# Patient Record
Sex: Female | Born: 1975 | Race: Black or African American | Hispanic: No | Marital: Married
Health system: Midwestern US, Community
[De-identification: ages and names within clinical notes are randomized; demographics above are authoritative.]

## PROBLEM LIST (undated history)

## (undated) DIAGNOSIS — M19071 Primary osteoarthritis, right ankle and foot: Secondary | ICD-10-CM

## (undated) DIAGNOSIS — D649 Anemia, unspecified: Secondary | ICD-10-CM

## (undated) DIAGNOSIS — I10 Essential (primary) hypertension: Secondary | ICD-10-CM

## (undated) HISTORY — PX: GASTRIC BYPASS: SHX52

## (undated) HISTORY — PX: OTHER SURGICAL HISTORY: SHX169

---

## 2014-10-01 ENCOUNTER — Emergency Department (HOSPITAL_COMMUNITY)
Admission: EM | Admit: 2014-10-01 | Discharge: 2014-10-01 | Disposition: A | Discharge status: Home / Self Care | Attending: Emergency Medicine | Admitting: Emergency Medicine

## 2014-10-01 ENCOUNTER — Encounter (HOSPITAL_COMMUNITY): Payer: Self-pay | Admitting: Family Medicine

## 2014-10-01 ENCOUNTER — Emergency Department (HOSPITAL_COMMUNITY)

## 2014-10-01 DIAGNOSIS — Z862 Personal history of diseases of the blood and blood-forming organs and certain disorders involving the immune mechanism: Secondary | ICD-10-CM | POA: Diagnosis not present

## 2014-10-01 DIAGNOSIS — R519 Headache, unspecified: Secondary | ICD-10-CM

## 2014-10-01 DIAGNOSIS — R000 Tachycardia, unspecified: Secondary | ICD-10-CM | POA: Diagnosis present

## 2014-10-01 DIAGNOSIS — F419 Anxiety disorder, unspecified: Secondary | ICD-10-CM

## 2014-10-01 DIAGNOSIS — F410 Panic disorder [episodic paroxysmal anxiety] without agoraphobia: Secondary | ICD-10-CM | POA: Diagnosis not present

## 2014-10-01 DIAGNOSIS — M549 Dorsalgia, unspecified: Secondary | ICD-10-CM | POA: Insufficient documentation

## 2014-10-01 DIAGNOSIS — R51 Headache: Secondary | ICD-10-CM | POA: Diagnosis not present

## 2014-10-01 HISTORY — DX: Anemia, unspecified: D64.9

## 2014-10-01 LAB — CBC WITH DIFFERENTIAL/PLATELET
BASOS ABS: 0.1 10*3/uL (ref 0.0–0.1)
Basophils Relative: 1 % (ref 0–1)
EOS ABS: 0.1 10*3/uL (ref 0.0–0.7)
EOS PCT: 2 % (ref 0–5)
HCT: 32.4 % — ABNORMAL LOW (ref 36.0–46.0)
Hemoglobin: 10.4 g/dL — ABNORMAL LOW (ref 12.0–15.0)
LYMPHS PCT: 31 % (ref 12–46)
Lymphs Abs: 1.7 10*3/uL (ref 0.7–4.0)
MCH: 26.4 pg (ref 26.0–34.0)
MCHC: 32.1 g/dL (ref 30.0–36.0)
MCV: 82.2 fL (ref 78.0–100.0)
Monocytes Absolute: 0.5 10*3/uL (ref 0.1–1.0)
Monocytes Relative: 9 % (ref 3–12)
NEUTROS PCT: 57 % (ref 43–77)
Neutro Abs: 3.2 10*3/uL (ref 1.7–7.7)
PLATELETS: 287 10*3/uL (ref 150–400)
RBC: 3.94 MIL/uL (ref 3.87–5.11)
RDW: 17.6 % — AB (ref 11.5–15.5)
WBC: 5.6 10*3/uL (ref 4.0–10.5)

## 2014-10-01 LAB — BASIC METABOLIC PANEL
Anion gap: 13 (ref 5–15)
BUN: 10 mg/dL (ref 6–23)
CALCIUM: 9 mg/dL (ref 8.4–10.5)
CO2: 24 mEq/L (ref 19–32)
Chloride: 95 mEq/L — ABNORMAL LOW (ref 96–112)
Creatinine, Ser: 0.65 mg/dL (ref 0.50–1.10)
GFR calc Af Amer: >90 mL/min (ref >90)
Glucose, Bld: 92 mg/dL (ref 70–99)
POTASSIUM: 3.6 meq/L — AB (ref 3.7–5.3)
Sodium: 132 mEq/L — ABNORMAL LOW (ref 137–147)

## 2014-10-01 LAB — I-STAT TROPONIN, ED: TROPONIN I, POC: 0.01 ng/mL (ref 0.00–0.08)

## 2014-10-01 LAB — D-DIMER, QUANTITATIVE: D-Dimer, Quant: 0.49 ug/mL-FEU — ABNORMAL HIGH (ref 0.00–0.48)

## 2014-10-01 MED ORDER — PROMETHAZINE HCL 25 MG/ML IJ SOLN
25.0000 mg | Freq: Once | INTRAMUSCULAR | Status: AC
  Administered 2014-10-01: 25 mg via INTRAVENOUS
  Filled 2014-10-01: qty 1

## 2014-10-01 MED ORDER — HYDROMORPHONE HCL 1 MG/ML IJ SOLN
0.5000 mg | Freq: Once | INTRAMUSCULAR | Status: AC
  Administered 2014-10-01: 0.5 mg via INTRAVENOUS
  Filled 2014-10-01: qty 1

## 2014-10-01 MED ORDER — HEPARIN SOD (PORK) LOCK FLUSH 100 UNIT/ML IV SOLN
500.0000 [IU] | Freq: Once | INTRAVENOUS | Status: AC
  Administered 2014-10-01: 500 [IU]
  Filled 2014-10-01: qty 5

## 2014-10-01 MED ORDER — OXYCODONE-ACETAMINOPHEN 5-325 MG PO TABS: 2.0000 | ORAL_TABLET | ORAL | Status: AC | PRN

## 2014-10-01 MED ORDER — CYCLOBENZAPRINE HCL 10 MG PO TABS: 10.0000 mg | ORAL_TABLET | Freq: Two times a day (BID) | ORAL | Status: AC | PRN

## 2014-10-01 MED ORDER — SODIUM CHLORIDE 0.9 % IV BOLUS (SEPSIS)
1000.0000 mL | Freq: Once | INTRAVENOUS | Status: AC
  Administered 2014-10-01: 1000 mL via INTRAVENOUS

## 2014-10-01 MED ORDER — PROMETHAZINE HCL 25 MG PO TABS: 25.0000 mg | ORAL_TABLET | Freq: Four times a day (QID) | ORAL | Status: AC | PRN

## 2014-10-01 MED ORDER — HYDROMORPHONE HCL 1 MG/ML IJ SOLN
1.0000 mg | Freq: Once | INTRAMUSCULAR | Status: AC
  Administered 2014-10-01: 1 mg via INTRAVENOUS
  Filled 2014-10-01: qty 1

## 2014-10-01 NOTE — Discharge Instructions (Signed)
Medication for pain and nausea. Increase fluids. Rest in quiet dark room. °

## 2014-10-01 NOTE — ED Notes (Signed)
Per pt sts she is here visiting [place] with her daughter and has been upset all morning. sts upper back pain, feels like her her hear rate and BP are up. sts her daughter is here in the ED as a patient.

## 2014-10-01 NOTE — ED Provider Notes (Signed)
Patient is hemodynamically stable. Decrease headache. No neurological deficits. Blood pressure is improving. Do not think CT head or CT angiography of chest indicated.  discharge medications Percocet and Phenergan 25 mg  Donnetta HutchingBrian Kirin Pastorino, MD 10/01/14 1909

## 2014-10-01 NOTE — ED Provider Notes (Signed)
CSN: [id number]     Arrival date & time 10/01/14  1301 History   First MD Initiated Contact with Patient 10/01/14 1500     Chief Complaint  Patient presents with  . Back Pain  . Tachycardia  . Anxiety      HPI Comments: [name] presents for the evaluation of rapid heart rate and headache. She reports that she came in because her daughter was having seizures and when she was waiting with her daughter she started feeling ill.  She reports headache that is predominantly on the right side as well as right-sided shoulder/back pain that is twinging in nature.she has a history of migraine headaches and prior admission to the hospital for headaches that hadt left-sided paralysis associated with them.  When her symptoms began she felt like her heart rate was high and she felt anxious. She denies history of DVT but does have an indwelling port for her blood transfusions and iron. Her grandmother has a history of DVT. She has a history of high blood pressure, she did take her blood pressure medicine today. Denies fevers, vomiting, hemoptysis, leg edema. She does report easy bruising.  Patient is a 38 y.o. female presenting with back pain and anxiety. The history is provided by the patient.  Back Pain Anxiety    Past Medical History  Diagnosis Date  . Anemia    Past Surgical History  Procedure Laterality Date  . Gastric bypass    . C section     History reviewed. No pertinent family history. History  Substance Use Topics  . Smoking status: Never Smoker   . Smokeless tobacco: Not on file  . Alcohol Use: No   OB History    No data available     Review of Systems  Musculoskeletal: Positive for back pain.  All other systems reviewed and are negative.     Allergies  Compazine; Gabapentin; Nsaids; Reglan; and Zofran  Home Medications   Prior to Admission medications   Not on File   BP 163/109 mmHg  Pulse 110  Temp(Src) 98.3 F (36.8 C) (Oral)  Resp 18  SpO2 98% Physical  Exam  Constitutional: She is oriented to person, place, and time. She appears well-developed and well-nourished.  Mild distress  HENT:  Head: Normocephalic and atraumatic.  Eyes: EOM are normal. Pupils are equal, round, and reactive to light.  Neck: Neck supple.  Mild tenderness over the right scapula and right paraspinous region  Cardiovascular: Normal rate and regular rhythm.   No murmur heard. Pulmonary/Chest: Effort normal and breath sounds normal. No respiratory distress.  Abdominal: Soft. There is no tenderness. There is no rebound and no guarding.  Musculoskeletal: She exhibits no edema or tenderness.  Neurological: She is alert and oriented to person, place, and time.  5 out of 5 strength in all 4 extremities. Sensation to light touch intact in all 4 extremities.  Skin: Skin is warm and dry.  Psychiatric: She has a normal mood and affect. Her behavior is normal.  Nursing note and vitals reviewed.   ED Course  Procedures (including critical care time) Labs Review Labs Reviewed  BASIC METABOLIC PANEL  CBC WITH DIFFERENTIAL  D-DIMER, QUANTITATIVE  I-STAT TROPOININ, ED  POC URINE PREG, ED    Imaging Review No results found.   EKG Interpretation   Date/Time:  Saturday October 01 2014 13:15:03 EST Ventricular Rate:  99 PR Interval:  166 QRS Duration: 82 QT Interval:  368 QTC Calculation: 472 R Axis:  79 Text Interpretation:  Normal sinus rhythm Biatrial enlargement Abnormal  ECG Confirmed by Lincoln Brighamees, Liz [phone]([id number]) on 10/01/2014 3:24:32 PM      MDM   Final diagnoses:  Headache, unspecified headache type    Patient here with headache and neck/back pain following a anxiety attack. Headache is similar to prior migraines feel subarachnoid hemorrhage unlikely. Clinical picture not consistent with dissection or ACS.    Tilden FossaElizabeth Shelina Luo, MD 10/02/14 [phone]

## 2015-07-06 ENCOUNTER — Encounter (HOSPITAL_COMMUNITY): Payer: Self-pay | Admitting: *Deleted

## 2015-07-06 ENCOUNTER — Emergency Department (HOSPITAL_COMMUNITY)
Admission: EM | Admit: 2015-07-06 | Discharge: 2015-07-06 | Disposition: A | Discharge status: Home / Self Care | Attending: Emergency Medicine | Admitting: Emergency Medicine

## 2015-07-06 ENCOUNTER — Emergency Department (HOSPITAL_COMMUNITY)

## 2015-07-06 DIAGNOSIS — M549 Dorsalgia, unspecified: Secondary | ICD-10-CM | POA: Insufficient documentation

## 2015-07-06 DIAGNOSIS — Z862 Personal history of diseases of the blood and blood-forming organs and certain disorders involving the immune mechanism: Secondary | ICD-10-CM | POA: Diagnosis not present

## 2015-07-06 DIAGNOSIS — R112 Nausea with vomiting, unspecified: Secondary | ICD-10-CM | POA: Diagnosis not present

## 2015-07-06 DIAGNOSIS — R1031 Right lower quadrant pain: Secondary | ICD-10-CM | POA: Diagnosis not present

## 2015-07-06 DIAGNOSIS — I10 Essential (primary) hypertension: Secondary | ICD-10-CM | POA: Diagnosis not present

## 2015-07-06 HISTORY — DX: Essential (primary) hypertension: I10

## 2015-07-06 LAB — COMPREHENSIVE METABOLIC PANEL
ALK PHOS: 77 U/L (ref 38–126)
ALT: 36 U/L (ref 14–54)
ANION GAP: 7 (ref 5–15)
AST: 20 U/L (ref 15–41)
Albumin: 3.3 g/dL — ABNORMAL LOW (ref 3.5–5.0)
BUN: 10 mg/dL (ref 6–20)
CALCIUM: 8.7 mg/dL — AB (ref 8.9–10.3)
CO2: 25 mmol/L (ref 22–32)
Chloride: 104 mmol/L (ref 101–111)
Creatinine, Ser: 0.59 mg/dL (ref 0.44–1.00)
Glucose, Bld: 95 mg/dL (ref 65–99)
Potassium: 3.9 mmol/L (ref 3.5–5.1)
Sodium: 136 mmol/L (ref 135–145)
Total Bilirubin: 0.4 mg/dL (ref 0.3–1.2)
Total Protein: 5.9 g/dL — ABNORMAL LOW (ref 6.5–8.1)

## 2015-07-06 LAB — CBC
HCT: 26.6 % — ABNORMAL LOW (ref 36.0–46.0)
HEMOGLOBIN: 8.4 g/dL — AB (ref 12.0–15.0)
MCH: 25.6 pg — AB (ref 26.0–34.0)
MCHC: 31.6 g/dL (ref 30.0–36.0)
MCV: 81.1 fL (ref 78.0–100.0)
Platelets: 322 10*3/uL (ref 150–400)
RBC: 3.28 MIL/uL — AB (ref 3.87–5.11)
RDW: 16.2 % — ABNORMAL HIGH (ref 11.5–15.5)
WBC: 5.5 10*3/uL (ref 4.0–10.5)

## 2015-07-06 LAB — URINALYSIS, ROUTINE W REFLEX MICROSCOPIC
Bilirubin Urine: NEGATIVE
GLUCOSE, UA: NEGATIVE mg/dL
Hgb urine dipstick: NEGATIVE
Ketones, ur: NEGATIVE mg/dL
LEUKOCYTES UA: NEGATIVE
Nitrite: NEGATIVE
PROTEIN: NEGATIVE mg/dL
Specific Gravity, Urine: 1.028 (ref 1.005–1.030)
Urobilinogen, UA: 0.2 mg/dL (ref 0.0–1.0)
pH: 5 (ref 5.0–8.0)

## 2015-07-06 LAB — URINE MICROSCOPIC-ADD ON

## 2015-07-06 LAB — LIPASE, BLOOD: LIPASE: 17 U/L — AB (ref 22–51)

## 2015-07-06 MED ORDER — FENTANYL CITRATE (PF) 100 MCG/2ML IJ SOLN
50.0000 ug | Freq: Once | INTRAMUSCULAR | Status: AC
  Administered 2015-07-06: 50 ug via NASAL

## 2015-07-06 MED ORDER — FENTANYL CITRATE (PF) 100 MCG/2ML IJ SOLN
INTRAMUSCULAR | Status: AC
  Filled 2015-07-06: qty 2

## 2015-07-06 MED ORDER — SODIUM CHLORIDE 0.9 % IV BOLUS (SEPSIS)
1000.0000 mL | Freq: Once | INTRAVENOUS | Status: AC
  Administered 2015-07-06: 1000 mL via INTRAVENOUS

## 2015-07-06 MED ORDER — PROMETHAZINE HCL 25 MG/ML IJ SOLN
12.5000 mg | Freq: Once | INTRAMUSCULAR | Status: AC
  Administered 2015-07-06: 12.5 mg via INTRAVENOUS
  Filled 2015-07-06: qty 1

## 2015-07-06 MED ORDER — IOHEXOL 300 MG/ML  SOLN: 25.0000 mL | Freq: Once | INTRAMUSCULAR | Status: DC | PRN

## 2015-07-06 MED ORDER — SODIUM CHLORIDE 0.9 % IV SOLN
INTRAVENOUS | Status: DC
  Administered 2015-07-06: 20:00:00 via INTRAVENOUS

## 2015-07-06 MED ORDER — HYDROMORPHONE HCL 1 MG/ML IJ SOLN
1.0000 mg | Freq: Once | INTRAMUSCULAR | Status: AC
  Administered 2015-07-06: 1 mg via INTRAVENOUS
  Filled 2015-07-06: qty 1

## 2015-07-06 MED ORDER — IOHEXOL 300 MG/ML  SOLN
80.0000 mL | Freq: Once | INTRAMUSCULAR | Status: AC | PRN
  Administered 2015-07-06: 100 mL via INTRAVENOUS

## 2015-07-06 MED ORDER — HEPARIN SOD (PORK) LOCK FLUSH 100 UNIT/ML IV SOLN
500.0000 [IU] | Freq: Once | INTRAVENOUS | Status: AC
  Administered 2015-07-06: 500 [IU]
  Filled 2015-07-06: qty 5

## 2015-07-06 MED ORDER — DIPHENHYDRAMINE HCL 50 MG/ML IJ SOLN
25.0000 mg | Freq: Once | INTRAMUSCULAR | Status: AC
  Administered 2015-07-06: 25 mg via INTRAVENOUS
  Filled 2015-07-06: qty 1

## 2015-07-06 NOTE — ED Notes (Signed)
Called Pharmacy to get heparin flush.

## 2015-07-06 NOTE — ED Provider Notes (Signed)
CSN: [id number]     Arrival date & time 07/06/15  1731 History   First MD Initiated Contact with Patient 07/06/15 1848     Chief Complaint  Patient presents with  . Abdominal Pain  . Back Pain  . Emesis     (Consider location/radiation/quality/duration/timing/severity/associated sxs/prior Treatment) Patient is a 39 y.o. female presenting with abdominal pain, back pain, and vomiting. The history is provided by the patient.  Abdominal Pain Associated symptoms: nausea   Associated symptoms: no chest pain, no diarrhea, no dysuria, no fever, no shortness of breath and no vomiting   Back Pain Associated symptoms: abdominal pain   Associated symptoms: no chest pain, no dysuria, no fever and no headaches   Emesis Associated symptoms: abdominal pain   Associated symptoms: no diarrhea and no headaches    patient lives in the [place] area. Was traveling through. Onset of right lower quadrant abdominal pain at about 6 this morning. Pain is 10 out of 10 pain radiates to the back. Associated with nausea no vomiting no diarrhea. Patient to gastric bypass surgery in 2008 at [facility]. Patient also had a hysterectomy in the spring for dysfunctional uterine bleeding and anemia problems. Patient still has some baseline anemia. Patient denies fever. Pain is very sharp in nature.  Past Medical History  Diagnosis Date  . Anemia   . Hypertension    Past Surgical History  Procedure Laterality Date  . Gastric bypass    . C section     No family history on file. Social History  Substance Use Topics  . Smoking status: Never Smoker   . Smokeless tobacco: None  . Alcohol Use: No   OB History    No data available     Review of Systems  Constitutional: Negative for fever.  HENT: Negative for congestion.   Eyes: Negative for visual disturbance.  Respiratory: Negative for shortness of breath.   Cardiovascular: Negative for chest pain.  Gastrointestinal: Positive for nausea and  abdominal pain. Negative for vomiting and diarrhea.  Genitourinary: Negative for dysuria.  Musculoskeletal: Positive for back pain.  Skin: Negative for rash.  Neurological: Negative for headaches.  Hematological: Does not bruise/bleed easily.  Psychiatric/Behavioral: Negative for confusion.      Allergies  Compazine; Gabapentin; Nsaids; Reglan; Tylenol; Zanaflex; and Zofran  Home Medications   Prior to Admission medications   Medication Sig Start Date End Date Taking? Authorizing Provider  cyclobenzaprine (FLEXERIL) 10 MG tablet Take 1 tablet (10 mg total) by mouth 2 (two) times daily as needed for muscle spasms. 10/01/14   Donnetta Hutching, MD  oxyCODONE-acetaminophen (PERCOCET) 5-325 MG per tablet Take 2 tablets by mouth every 4 (four) hours as needed. 10/01/14   Donnetta Hutching, MD  promethazine (PHENERGAN) 25 MG tablet Take 1 tablet (25 mg total) by mouth every 6 (six) hours as needed for nausea. 10/01/14   Donnetta Hutching, MD   BP 134/62 mmHg  Pulse 77  Temp(Src) 98.4 F (36.9 C) (Oral)  Resp 20  SpO2 97% Physical Exam  Constitutional: She is oriented to person, place, and time. She appears well-developed and well-nourished. She appears distressed.  HENT:  Head: Normocephalic and atraumatic.  Mouth/Throat: Oropharynx is clear and moist.  Eyes: Conjunctivae and EOM are normal. Pupils are equal, round, and reactive to light.  Neck: Normal range of motion.  Cardiovascular: Normal rate, regular rhythm and normal heart sounds.   No murmur heard. Pulmonary/Chest: Effort normal and breath sounds normal. No respiratory distress.  Abdominal: Soft. Bowel sounds are normal. There is no tenderness.  Musculoskeletal: Normal range of motion. She exhibits no edema.  Neurological: She is alert and oriented to person, place, and time. No cranial nerve deficit. She exhibits normal muscle tone. Coordination normal.  Skin: Skin is warm. No erythema.  Nursing note and vitals reviewed.   ED Course   Procedures (including critical care time) Labs Review Labs Reviewed  LIPASE, BLOOD - Abnormal; Notable for the following:    Lipase 17 (*)    All other components within normal limits  COMPREHENSIVE METABOLIC PANEL - Abnormal; Notable for the following:    Calcium 8.7 (*)    Total Protein 5.9 (*)    Albumin 3.3 (*)    All other components within normal limits  CBC - Abnormal; Notable for the following:    RBC 3.28 (*)    Hemoglobin 8.4 (*)    HCT 26.6 (*)    MCH 25.6 (*)    RDW 16.2 (*)    All other components within normal limits  URINALYSIS, ROUTINE W REFLEX MICROSCOPIC (NOT AT [facility]) - Abnormal; Notable for the following:    APPearance TURBID (*)    All other components within normal limits  URINE MICROSCOPIC-ADD ON - Abnormal; Notable for the following:    Crystals CA OXALATE CRYSTALS (*)    All other components within normal limits    Imaging Review Ct Abdomen Pelvis W Contrast  07/06/2015   CLINICAL DATA:  Abdominal pain  EXAM: CT ABDOMEN AND PELVIS WITH CONTRAST  TECHNIQUE: Multidetector CT imaging of the abdomen and pelvis was performed using the standard protocol following bolus administration of intravenous contrast.  CONTRAST:  OMNIPAQUE IOHEXOL 300 MG/ML  SOLN  COMPARISON:  None.  FINDINGS: BODY WALL: Subcutaneous reticulation over the low pelvis is likely scarring neighboring remote incision. Vulva piercing. Small fatty right inguinal hernia.  LOWER CHEST: Small sliding hiatal hernia with gastroesophageal reflux or poor clearance from the esophagus.  ABDOMEN/PELVIS:  Liver: No focal abnormality.  Biliary: Cholecystectomy.  Pancreas: Unremarkable.  Spleen: Unremarkable.  Adrenals: Unremarkable.  Kidneys and ureters: No hydronephrosis or stone. 1 cm probable cyst in the upper right kidney.  Bladder: Unremarkable.  Reproductive: Hysterectomy. The right ovary at least likely remains, and is contiguous with small volume simple density fluid in the pelvis.  Bowel: Gastric  bypass without obstruction. No inflammatory bowel wall thickening. Prominent volume of stool and gas throughout the colon. No appendicitis.  Retroperitoneum: No mass or adenopathy.  Peritoneum: Small free pelvic fluid which could be physiologic given the right ovary likely remains.  Vascular: No acute abnormality.  OSSEOUS: Dorsal column stimulator. Relatively mild L5-S1 disc degeneration.  IMPRESSION: 1. Prominent colonic gas and stool. 2. Small pelvic fluid which can be physiologic. 3. Gastric bypass, cholecystectomy, and hysterectomy.   Electronically Signed   By: Marnee Spring M.D.   On: 07/06/2015 21:44   Dg Chest Port 1 View  07/06/2015   CLINICAL DATA:  Chest pain for 1 day.  EXAM: PORTABLE CHEST - 1 VIEW  COMPARISON:  10/01/2014.  FINDINGS: Normal sized heart. Interval minimal linear density at the left lung base. Otherwise, clear lungs. Right PICC tip in the superior vena cava. Neural stimulator leads overlying the lower thoracic spine.  IMPRESSION: Interval minimal left basilar linear atelectasis or scarring.   Electronically Signed   By: Beckie Salts M.D.   On: 07/06/2015 20:22   I have personally reviewed and evaluated these images and lab results  as part of my medical decision-making.   EKG Interpretation None      MDM   Final diagnoses:  Right lower quadrant abdominal pain    Patient with extensive workup for fairly severe right lower quadrant abdominal pain patient also has gastric bypass surgery in the past however CT shows that the the bypasses all patent no significant findings. No significant lab findings. No leukocytosis patient does have some mild anemia but transfusion not required. Urinalysis has some calcium oxalate crystals but no evidence of any ureteral stones. Patient improved here with fluids pain medicine and antinausea medicine. Patient does have a history of IBS perhaps that was the cause of the pain. Patient is from the [facility]  area.    Vanetta Mulders, MD 07/06/15 [phone]

## 2015-07-06 NOTE — Discharge Instructions (Signed)
Continue your current medications. Follow-up with your doctors upon return to [place]. Today's workup without any significant findings.

## 2015-07-06 NOTE — ED Notes (Signed)
Patient returned from CT

## 2015-07-06 NOTE — ED Notes (Signed)
Pt is here with pain to right lower back and right lower abdomen.  Reports nausea and vomiting.  No urinary symptoms. 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 LMP: Hysterectomy

## 2015-07-06 NOTE — ED Notes (Signed)
Patient is itching after medication given. MD made aware of patient itching and patient's complaint of nausea.

## 2015-07-06 NOTE — ED Notes (Signed)
Selena Batten, RN at the bedside attempting access.

## 2015-07-06 NOTE — ED Notes (Signed)
MD made aware that patient's port was accessed and he could come see patient. MD at the bedside.

## 2015-07-06 NOTE — ED Notes (Signed)
Patient transported to CT with transporter 

## 2015-07-06 NOTE — ED Notes (Signed)
Attempted Port Access Twice. Unable to access.

## 2016-10-08 ENCOUNTER — Ambulatory Visit: Admit: 2016-10-08 | Discharge: 2016-10-08 | Payer: TRICARE (CHAMPUS) | Attending: Family | Primary: Family

## 2016-10-08 DIAGNOSIS — I10 Essential (primary) hypertension: Secondary | ICD-10-CM

## 2016-10-08 MED ORDER — ACETAMINOPHEN-CODEINE 300 MG-60 MG TAB
300-60 mg | ORAL_TABLET | Freq: Three times a day (TID) | ORAL | 0 refills | Status: DC | PRN
Start: 2016-10-08 — End: 2016-10-17

## 2016-10-08 MED ORDER — TRAMADOL 50 MG TAB
50 mg | ORAL_TABLET | Freq: Three times a day (TID) | ORAL | 0 refills | Status: DC | PRN
Start: 2016-10-08 — End: 2016-10-08

## 2016-10-08 MED ORDER — RIFAXIMIN 550 MG TAB
550 mg | ORAL_TABLET | Freq: Three times a day (TID) | ORAL | 0 refills | Status: DC
Start: 2016-10-08 — End: 2016-10-17

## 2016-10-08 NOTE — Progress Notes (Signed)
Chief Complaint   Patient presents with   ??? Establish Care   ??? Other     IBS-D       Sara Elliott is a 40 y.o. female        HPI  Patient coming in today to establish care.    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  States that she takes bentyl 10mg  qid as needed for pain, and diarrhea.  States that taking phenegran suppositories, and anti-diarrhea medications.  States that she takes the cholestyramine 4 gram tid with meals.  States that still having frequent movements when on this regiment, but was somewhat controlled.  States that was having 12 BMs a day, and hemorroids.  States that was seeing a pain management doctor and given belbuqa, states that when she started it, was having severe diarrhea, and IBS got much worse.   Has had gall bladder removed.   Denies ever using viberzi or xifaxin.    She had numerous scopes, and was told to see a gastro provider.     States that was having SVT runs, and was put on [place] by primary doctor.   States that in past 4 weeks, has had 3 episodes of svt, but calmed down.    States that had on some blood  Pressure.  States that her old primary care doctor took her off it.   States that potassium iron, and calcium has been off.   Has ankle fracture, and unable to remove hardware due to arthritis.  States that this is what was seeing pain management for.      Past Medical History:   Diagnosis Date   ??? Anemia    ??? Anxiety    ??? Arthritis    ??? Depression    ??? Hypertension    ??? Irritable bowel syndrome (IBS)     with diarrhea   ??? Migraine    ??? Paralysis (HCC)    ??? PTSD (post-traumatic stress disorder)    ??? SVT (supraventricular tachycardia) (HCC)        Social History     Social History   ??? Marital status: MARRIED     Spouse name: N/A   ??? Number of children: N/A   ??? Years of education: N/A     Occupational History   ??? Not on file.     Social History Main Topics   ??? Smoking status: Current Some Day Smoker     Years: 22.00   ??? Smokeless tobacco: Never Used   ??? Alcohol use Yes       Comment: occ   ??? Drug use: No   ??? Sexual activity: Yes     Partners: Male     Other Topics Concern   ??? Not on file     Social History Narrative   ??? No narrative on file       Family History   Problem Relation Age of Onset   ??? Hypertension Mother    ??? Hypertension Father    ??? Osteoporosis Maternal Grandmother    ??? Hypertension Maternal Grandmother    ??? Heart Disease Maternal Grandmother    ??? Alcohol abuse Maternal Grandfather    ??? Hypertension Paternal Grandmother    ??? Breast Cancer Paternal Grandmother    ??? No Known Problems Paternal Grandfather        Current Outpatient Prescriptions   Medication Sig Dispense Refill   ??? loperamide (IMODIUM) 2 mg capsule TK 1 C PO QID PRN  4   ??? hydrOXYzine HCl (ATARAX) 50 mg tablet TK 1 T PO HS  2   ??? busPIRone (BUSPAR) 5 mg tablet TK 1 T PO BID  2   ??? escitalopram oxalate (LEXAPRO) 20 mg tablet TK 1 T PO  QAM  1   ??? CARTIA XT 240 mg ER capsule   2   ??? dicyclomine (BENTYL) 10 mg capsule TK ONE C PO  QID PRN P  0   ??? PROMETHEGAN 25 mg suppository INSERT 1 SUPPOSITORY RECTALLY Q 6 H PRN  0   ??? QUEtiapine (SEROQUEL) 100 mg tablet TK 1 T PO  QHS  1   ??? CHOLESTYRAMINE LIGHT 4 gram packet   3   ??? eszopiclone (LUNESTA) 3 mg tablet Take  by mouth nightly.     ??? prazosin (MINIPRESS) 1 mg capsule Take  by mouth nightly.     ??? lidocaine (LIDODERM) 5 %   9         Ros- see hpi      Visit Vitals   ??? BP (!) 168/110   ??? Pulse 68   ??? Temp 97.7 ??F (36.5 ??C) (Tympanic)   ??? Ht 5\' 6"  (1.676 m)   ??? Wt 169 lb (76.7 kg)   ??? SpO2 98%   ??? BMI 27.28 kg/m2           Physical Examination: General appearance - alert, well appearing, and in no distress and oriented to person, place, and time  Mental status - alert, oriented to person, place, and time, normal mood, behavior, speech, dress, motor activity, and thought processes  Eyes - pupils equal and reactive, extraocular eye movements intact  Ears - bilateral TM's and external ear canals normal  Nose - normal and patent, no erythema, discharge or polyps   Mouth - mucous membranes moist, pharynx normal without lesions  Neck - supple, no significant adenopathy  Lymphatics - no palpable lymphadenopathy, no hepatosplenomegaly  Chest - clear to auscultation, no wheezes, rales or rhonchi, symmetric air entry  Heart - normal rate, regular rhythm, normal S1, S2, no murmurs, rubs, clicks or gallops  Abdomen - soft, nontender, nondistended, no masses or organomegaly  Neurological - alert, oriented, normal speech, no focal findings or movement disorder noted  Musculoskeletal - no joint tenderness, deformity or swelling  Skin - right foot, on the ball of the right foot, is an area, that appears to be a callous.          .  Assessment and Plan:        1. Essential hypertension  Will continue cartia.  Will check labs , needs to probably get back on benazapril, but want to see what potassium is.   - Lipid Panel ([id number])  - TSH (82956([id number])  - CBC With Differential/Platelet (21308(85025)  - Comp. Metabolic Panel (14) (80053)    2. Irritable bowel syndrome with diarrhea  Will try xifaxin, pt. Instructed on use.  Will stop the immodium for now.  Will still use the bentyl.      3. Iron deficiency anemia, unspecified iron deficiency anemia type  Will check levels today. Has had iron infusions in past.  States that hasn'tt had one this year, but is having pica..   - Iron and TIBC (83540/ 83550)  - Ferritin, Serum (82728)    4. DDD (degenerative disc disease), cervical      5. DDD (degenerative disc disease), lumbar      7. Arthritis of right ankle  Will refer to pain management for evaluation.  Looks like tried Hydrographic surveyorbelbuca and didn't work.  I"m going to give her  30 day script of tramadol to try to help with pain while awaiting referral.

## 2016-10-09 LAB — METABOLIC PANEL, COMPREHENSIVE
A-G Ratio: 1.5 (ref 1.2–2.2)
ALT (SGPT): 16 IU/L (ref 0–32)
AST (SGOT): 22 IU/L (ref 0–40)
Albumin: 4.1 g/dL (ref 3.5–5.5)
Alk. phosphatase: 60 IU/L (ref 39–117)
BUN/Creatinine ratio: 21 (ref 9–23)
BUN: 14 mg/dL (ref 6–20)
Bilirubin, total: 0.3 mg/dL (ref 0.0–1.2)
CO2: 22 mmol/L (ref 18–29)
Calcium: 8.8 mg/dL (ref 8.7–10.2)
Chloride: 101 mmol/L (ref 96–106)
Creatinine: 0.67 mg/dL (ref 0.57–1.00)
GFR est AA: 129 mL/min/{1.73_m2} (ref >59)
GFR est non-AA: 112 mL/min/{1.73_m2} (ref >59)
GLOBULIN, TOTAL: 2.8 g/dL (ref 1.5–4.5)
Glucose: 91 mg/dL (ref 65–99)
Potassium: 4.3 mmol/L (ref 3.5–5.2)
Protein, total: 6.9 g/dL (ref 6.0–8.5)
Sodium: 138 mmol/L (ref 134–144)

## 2016-10-09 LAB — LIPID PANEL
Cholesterol, total: 134 mg/dL (ref 100–199)
HDL Cholesterol: 86 mg/dL (ref >39)
LDL, calculated: 34 mg/dL (ref 0–99)
Triglyceride: 70 mg/dL (ref 0–149)
VLDL, calculated: 14 mg/dL (ref 5–40)

## 2016-10-09 LAB — IRON PROFILE
Iron % saturation: 28 % (ref 15–55)
Iron: 117 ug/dL (ref 27–159)
TIBC: 412 ug/dL (ref 250–450)
UIBC: 295 ug/dL (ref 131–425)

## 2016-10-09 LAB — FERRITIN: Ferritin: 13 ng/mL — ABNORMAL LOW (ref 15–150)

## 2016-10-09 LAB — CBC WITH AUTOMATED DIFF
ABS. BASOPHILS: 0 10*3/uL (ref 0.0–0.2)
ABS. EOSINOPHILS: 0.3 10*3/uL (ref 0.0–0.4)
ABS. IMM. GRANS.: 0 10*3/uL (ref 0.0–0.1)
ABS. MONOCYTES: 0.4 10*3/uL (ref 0.1–0.9)
ABS. NEUTROPHILS: 3.1 10*3/uL (ref 1.4–7.0)
Abs Lymphocytes: 2.4 10*3/uL (ref 0.7–3.1)
BASOPHILS: 1 %
EOSINOPHILS: 4 %
HCT: 37 % (ref 34.0–46.6)
HGB: 11.9 g/dL (ref 11.1–15.9)
IMMATURE GRANULOCYTES: 0 %
Lymphocytes: 39 %
MCH: 30 pg (ref 26.6–33.0)
MCHC: 32.2 g/dL (ref 31.5–35.7)
MCV: 93 fL (ref 79–97)
MONOCYTES: 7 %
NEUTROPHILS: 49 %
PLATELET: 240 10*3/uL (ref 150–379)
RBC: 3.97 x10E6/uL (ref 3.77–5.28)
RDW: 15.3 % (ref 12.3–15.4)
WBC: 6.3 10*3/uL (ref 3.4–10.8)

## 2016-10-09 LAB — TSH 3RD GENERATION: TSH: 0.515 u[IU]/mL (ref 0.450–4.500)

## 2016-10-09 MED ORDER — BENAZEPRIL 10 MG TAB
10 mg | ORAL_TABLET | Freq: Every day | ORAL | 3 refills | Status: AC
Start: 2016-10-09 — End: ?

## 2016-10-09 NOTE — Telephone Encounter (Signed)
Sorry to hear that, then don't take the codeine anymore  There isn't another option for ibs, but we can try to do a PA for the xifaxin if available.

## 2016-10-09 NOTE — Progress Notes (Signed)
Cholesterol is fantastic.   Thyroid is really good.   Glucose, kidney, liver, potassium, and other electrolyes are good.   Iron levels are actually ok, right now, ferritin a little low, but hgb staying at an ok level of 11.9.    So I am going to send her in a script for benazepril to help with her b/p . Take medication daily  With her cartia.

## 2016-10-09 NOTE — Progress Notes (Signed)
lvm for a call back

## 2016-10-09 NOTE — Telephone Encounter (Signed)
Patient called stating that insurance will not cover the medication you prescribed for her IBS. Pt is requesting an alternative or to speak with the pharmacist. Pt also called to inform us that the tylenol 4 you prescribed her with codeine caused her to go into anaphylactic shock and EMS was called to get her from work. Pt was transported to [facility] and she was told she is allergic to codeine. Please advise

## 2016-10-09 NOTE — Addendum Note (Signed)
Addended by: Sophronia SimasKEATON, Ermel Verne T on: 10/09/2016 09:00 AM      Modules accepted: Orders

## 2016-10-09 NOTE — Telephone Encounter (Signed)
lvm w/ below info.  I called [phone] for the pa form for rifaximin 550mg .  Her ID is [id number]. They are faxing the form over now

## 2016-10-09 NOTE — Progress Notes (Signed)
Pt stopped into the office today so I took her back to a room to take her vitals and give her her lab results.Her vitals in the room today were 139-89 BP, oxygen 96% and pulse 106.  She got my message that matt NP couldn't give her any pain medication

## 2016-10-14 NOTE — Telephone Encounter (Signed)
lvm for pt to call back and discuss labs

## 2016-10-14 NOTE — Telephone Encounter (Signed)
Pa form on matthews NP desk

## 2016-10-14 NOTE — Telephone Encounter (Signed)
Patient called for her lab results. Please advise.

## 2016-10-16 ENCOUNTER — Encounter: Attending: Internal Medicine | Primary: Family

## 2016-10-17 ENCOUNTER — Encounter: Attending: Cardiovascular Disease | Primary: Family

## 2016-10-17 ENCOUNTER — Ambulatory Visit: Admit: 2016-10-17 | Discharge: 2016-10-17 | Payer: TRICARE (CHAMPUS) | Attending: Internal Medicine | Primary: Family

## 2016-10-17 ENCOUNTER — Ambulatory Visit: Attending: Internal Medicine | Primary: Family

## 2016-10-17 DIAGNOSIS — I471 Supraventricular tachycardia: Secondary | ICD-10-CM

## 2016-10-17 NOTE — Progress Notes (Signed)
[address], [address]  [place], [place] [address]  PHONE: [phone]      Sara Elliott  07-Jul-1976      SUBJECTIVE:   Sara Elliott is a 40 y.o. female seen for a consultation visit regarding the following:     Chief Complaint   Patient presents with   ??? Referral / Consult     SVT per Dr. Hale Bogus          HPI:  Consultation is requested by Earney Hamburg, NP for evaluation of Referral / Consult (SVT per Dr. Hale Bogus)  She has h/o SVT, dx in [place] last year.  Been on cartia.  Been seen in [place] ERs, given adenosine and others before as she tells me today.  Also seen in [facility] ER for SVT.  Having more tachy-palpitations still now.   More episodes now.  Has not worn monitor since last yr in [place].   Having 4-5 episodes per week, assoc fatigue and weakness as well.    No edema.  Patient denies recent history of orthopnea, PND, excessive dizziness and/or syncope.  I have reviewed the prior cardiac testing and results with the patient today.     She does struggle with PTSD, anxiety and depression by her admission today.          Past Medical History, Past Surgical History, Family history, Social History, and Medications were all reviewed with the patient today and updated as necessary.     Outpatient Prescriptions Marked as Taking for the 10/17/16 encounter (Office Visit) with Guss Bunde, DO   Medication Sig Dispense Refill   ??? spironolactone (ALDACTONE) 50 mg tablet Take  by mouth daily.     ??? benazepril (LOTENSIN) 10 mg tablet Take 1 Tab by mouth daily. 30 Tab 3   ??? loperamide (IMODIUM) 2 mg capsule TK 1 C PO QID PRN  4   ??? hydrOXYzine HCl (ATARAX) 50 mg tablet TK 1 T PO HS  2   ??? busPIRone (BUSPAR) 5 mg tablet TK 1 T PO BID  2   ??? escitalopram oxalate (LEXAPRO) 20 mg tablet TK 1 T PO  QAM  1   ??? CARTIA XT 240 mg ER capsule   2   ??? dicyclomine (BENTYL) 10 mg capsule TK ONE C PO  QID PRN P  0   ??? PROMETHEGAN 25 mg suppository INSERT 1 SUPPOSITORY RECTALLY Q 6 H PRN  0    ??? QUEtiapine (SEROQUEL) 100 mg tablet TK 1 T PO  QHS  1   ??? CHOLESTYRAMINE LIGHT 4 gram packet   3   ??? eszopiclone (LUNESTA) 3 mg tablet Take  by mouth nightly.     ??? prazosin (MINIPRESS) 1 mg capsule Take  by mouth nightly.     ??? lidocaine (LIDODERM) 5 %   9     Allergies   Allergen Reactions   ??? Codeine Anaphylaxis   ??? Compazine [Prochlorperazine] Other (comments)     irritable   ??? Gabapentin Hives     Swollen throat   ??? 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 Reglan [Metoclopramide Hcl] Anxiety     irritable   ??? Zanaflex [Tizanidine] Rash     itchy   ??? Zofran (As Hydrochloride) [Ondansetron Hcl] Other (comments)     headaches     Patient Active Problem List    Diagnosis   ??? Paroxysmal SVT (supraventricular tachycardia) (HCC)   ???  Tobacco abuse   ??? DDD (degenerative disc disease), lumbar   ??? DDD (degenerative disc disease), cervical   ??? Essential hypertension   ??? Irritable bowel syndrome with diarrhea   ??? Iron deficiency anemia     Past Surgical History:   Procedure Laterality Date   ??? HX CESAREAN SECTION      5   ??? HX CHOLECYSTECTOMY  2015   ??? HX GASTRIC BYPASS  2008   ??? HX GYN  2014    partial HYSTER   ??? HX ORTHOPAEDIC      9 screws in RT ankle and 2 plates in RT ankle   ??? HX OTHER SURGICAL  2014    neurostimulator implant   ??? HX OTHER SURGICAL  2012    power port RT arm    ??? HX OTHER SURGICAL      tummy tuck 2     Family History   Problem Relation Age of Onset   ??? Hypertension Mother    ??? Hypertension Father    ??? Osteoporosis Maternal Grandmother    ??? Hypertension Maternal Grandmother    ??? Heart Disease Maternal Grandmother    ??? Alcohol abuse Maternal Grandfather    ??? Hypertension Paternal Grandmother    ??? Breast Cancer Paternal Grandmother    ??? No Known Problems Paternal Grandfather      Social History   Substance Use Topics   ??? Smoking status: Current Some Day Smoker     Years: 22.00   ??? Smokeless tobacco: Never Used   ??? Alcohol use Yes       Comment: occ       ROS:    Constitutional:   Negative for fevers and unexplained weight loss.  Eyes:   Negative for visual disturbance.  ENT:   Negative for significant hearing loss and tinnitus.  Respiratory:   Negative for hemoptysis.  Cardiovascular:   Negative except as noted in HPI.  Gastrointestinal:   Negative for melena and abdominal pain.  Genitourinary:   Negative for hematuria, renal stones.  Integumentary:   Negative for rash or non-healing wounds  Hematologic/Lymphatic:   Negative for excessive bleeding hx or clotting disorder.  Musculoskeletal:  Negative for active, unexplained/severe joint pain.  Neurological:   Negative for stroke.   Behavioral/Psych:   Negative for suicidal ideations.   Endocrine:   Negative for uncontrolled diabetic symptoms including polyuria, polydipsia and poor wound healing.     PHYSICAL EXAM:       Visit Vitals   ??? BP 146/88   ??? Pulse 78   ??? Ht 5\' 6"  (1.676 m)   ??? Wt 164 lb (74.4 kg)   ??? BMI 26.47 kg/m2      General/Constitutional:   Alert and oriented x 3, no acute distress  HEENT:   normocephalic, atraumatic, moist mucous membranes  Neck:   No JVD or carotid bruits bilaterally  Cardiovascular:   regular rate and rhythm, no murmur/rub/gallop appreciated  Pulmonary:   clear to auscultation bilaterally, no respiratory distress  Abdomen:   soft, non-tender, non-distended  Ext:   No sig LE edema bilaterally  Skin:  warm and dry, no obvious rashes seen  Neuro:   no obvious sensory or motor deficits  Psychiatric:   normal mood and affect    EKG Today: sinus rhythm, normal intervals and non-specific ST/T wave changes.    Medical problems, medical history and test results were reviewed with the patient today.  No results found for this or any previous visit (from the past 168 hour(s)).  Lab Results   Component Value Date/Time    Cholesterol, total 134 10/08/2016 04:25 PM    HDL Cholesterol 86 10/08/2016 04:25 PM    LDL, calculated 34 10/08/2016 04:25 PM     VLDL, calculated 14 10/08/2016 04:25 PM    Triglyceride 70 10/08/2016 04:25 PM         ASSESSMENT:    Diagnoses and all orders for this visit:    1. Supraventricular tachycardia (HCC)  -     AMB POC EKG ROUTINE W/ 12 LEADS, INTER & REP  -     AMB POC EXTERNAL MOBILE CV TELEMETRY W/I&REPORT UP TO 30 DAYS  -     AMB POC ECG EVENT MONITOR, UP TO 30 DAYS  -     ECHO COMPLETE STUDY; Future    2. Essential hypertension    3. Paroxysmal SVT (supraventricular tachycardia) (HCC)    4. Tobacco abuse          IMPRESSION:     1. PSVT:  H/o such in [place].  More palp now.  Sx getting worse.  Plan on tele monitor and echo.  Then refer to EP for eval and mgmt.  Labs reviewed.   No NST or LHC needed, no angina.   Ablation reviewed today as potential treatment option, she is very interested.   Remain on the CCB for now.     2. HTN:  Follow, has been well controlled.  Check echo.      3. Tobacco Use:  Cessation stressed.     The patient has been instructed to call with any angina or equivalent as reviewed today. All questions were answered with the patient voicing complete understanding.      Patient has been instructed and agrees to call our office with any issues or other concerns related to their cardiac condition(s) and/or complaint(s).      Follow-up Disposition:  Return for Return after test(s).      Guss BundeEdward A Annalissa Murphey, DO  10/17/2016

## 2016-10-21 MED ORDER — DICYCLOMINE 10 MG CAP
10 mg | ORAL_CAPSULE | ORAL | 3 refills | Status: AC
Start: 2016-10-21 — End: ?

## 2016-10-21 MED ORDER — LOPERAMIDE 2 MG CAP
2 mg | ORAL_CAPSULE | ORAL | 4 refills | Status: AC
Start: 2016-10-21 — End: ?

## 2016-10-21 MED ORDER — PROMETHAZINE 25 MG TAB
25 mg | ORAL_TABLET | Freq: Four times a day (QID) | ORAL | 1 refills | Status: AC | PRN
Start: 2016-10-21 — End: ?

## 2016-10-21 MED ORDER — CHOLESTYRAMINE-ASPARTAME 4 GRAM PACKET
4 gram | Freq: Three times a day (TID) | ORAL | 3 refills | Status: AC
Start: 2016-10-21 — End: ?

## 2016-10-21 NOTE — Telephone Encounter (Signed)
I sent in more immodium, bentyl, cholestyramine.  Also sent in promethazine tablets.    Did she try the xifaxin

## 2016-10-21 NOTE — Telephone Encounter (Signed)
She did not know about the xifaxin approval and will pick her rxs up tonight

## 2016-10-21 NOTE — Telephone Encounter (Signed)
Is in terrible shape with her IBS. Lost probably 5 lbs since last night, bad headache. Is not on any meds right now. Is out of Lomotil, Bentyl. Has the Cholestyramine packets, Needs promethazine tab, suppositories are worsening her diarrhea. Hs been having lowgrade temps. Feels dehydrated. Everything goes straight through her.

## 2016-10-21 NOTE — Telephone Encounter (Signed)
Patient called in stating that her IBS is very bad right now. She said she used the bathroom about 10 times since last night. Please advise.

## 2016-10-28 NOTE — Telephone Encounter (Signed)
Pt didn't hear back from pain management and she has a question about one of her medications

## 2016-10-28 NOTE — Telephone Encounter (Signed)
Pt notified.

## 2016-10-28 NOTE — Telephone Encounter (Signed)
I explained last visit, our office does not do controlled 2 narcotic for pain control.  That is said when the patients make their first visit with us, that we do not prescribed continuing controlled pain medication.  I can not control what other offices do (pain management) as far as appointments go, however we are working on her referral. She knew this prior to scheduling first appointment.

## 2016-10-28 NOTE — Telephone Encounter (Signed)
Pt states she was prescribed  tylenol with codeine and she had an allergic reaction to it. she was rushed to ER due to having seizures. She was stuck with an epipen 4 times. She can not take tramadol. She has nothing for the pain. She is requesting a 2 week prescription for the pain. Pain management has not contacted her yet so Sara Elliott has resent a referral to another office and was told that it would be another month before they would see her. She is asking if you would prescribe something for the pain. That is all she wants. She understands what was said at last visit regarding pain medicine but she came to our office seeking help. She works 9-12 hour days which makes it hard on her.

## 2016-11-01 NOTE — Telephone Encounter (Signed)
Just an update:    Per [facility] office is waiting for MD to finish reviewing previous records. They will then call pt to schedule. That was a call received yesterday.

## 2016-11-07 ENCOUNTER — Encounter: Payer: TRICARE (CHAMPUS) | Attending: Family | Primary: Family

## 2016-11-14 ENCOUNTER — Encounter: Attending: Family | Primary: Family

## 2016-11-14 ENCOUNTER — Institutional Professional Consult (permissible substitution): Admit: 2016-11-14 | Discharge: 2016-11-14 | Payer: TRICARE (CHAMPUS) | Primary: Family

## 2016-11-14 ENCOUNTER — Institutional Professional Consult (permissible substitution): Primary: Family

## 2016-11-14 DIAGNOSIS — I471 Supraventricular tachycardia: Secondary | ICD-10-CM

## 2016-11-14 NOTE — Progress Notes (Signed)
Echo  study completed. See interpretation scanned to the order.

## 2016-11-19 NOTE — Telephone Encounter (Signed)
Received EOS report from [facility]. Report printed and put [place]. Patient has follow up with Dr. Marin RobertsMcCotter 11-21-16 MA//brendab

## 2016-11-20 NOTE — Telephone Encounter (Signed)
Done.

## 2016-11-20 NOTE — Telephone Encounter (Signed)
Please deny. She has an allergy to this.

## 2016-11-21 ENCOUNTER — Encounter: Attending: Cardiovascular Disease | Primary: Family

## 2016-11-25 ENCOUNTER — Encounter: Attending: Cardiovascular Disease | Primary: Family

## 2016-11-26 ENCOUNTER — Ambulatory Visit
Admit: 2016-11-26 | Discharge: 2016-11-26 | Payer: TRICARE (CHAMPUS) | Attending: Cardiovascular Disease | Primary: Family

## 2016-11-26 DIAGNOSIS — I471 Supraventricular tachycardia: Secondary | ICD-10-CM

## 2016-11-26 NOTE — Progress Notes (Signed)
[facility]  [address], [address]  [place], [place] [address]  PHONE: [phone]  Sara Elliott  1976-08-24  PCP: Earney Hamburg, NP    SUBJECTIVE:   Sara Elliott is a 41 y.o. female seen for a consultation visit regarding the following:     Chief Complaint   Patient presents with   ??? Referral / Consult     per Dr. Roseanne Reno for SVT           Consultation is requested by Sander Nephew for evaluation of Referral / Consult (per Dr. Roseanne Reno for SVT )      Reason for Consultation: Tachycardia    History:  This is a 41 year old patient with history of reported tachycardia in the past.  She reports having an episode of tachycardia which she has called EMS.  She reports receiving adenosine that did not break the tachycardia.  She had a monitor for approximately one month that demonstrated sinus tachycardia with no abnormal SVT's seen    Cardiac PMH: (Old records have been reviewed and summarized below)  1.  Tachycardia-currently defined as sinus tachycardia by monitor  2.  Anxiety  3.  Depression  4.  Chronic pain disorder      Past Medical History, Past Surgical History, Family history, Social History, and Medications were all reviewed with the patient today and updated as necessary.     Current Outpatient Prescriptions   Medication Sig Dispense Refill   ??? dicyclomine (BENTYL) 10 mg capsule Take 1 tab qid prn ibs 120 Cap 3   ??? loperamide (IMODIUM) 2 mg capsule TK 1 C PO QID PRN 120 Cap 4   ??? cholestyramine-aspartame (CHOLESTYRAMINE LIGHT) 4 gram packet Take 1 Packet by mouth three (3) times daily. 90 Each 3   ??? promethazine (PHENERGAN) 25 mg tablet Take 1 Tab by mouth every six (6) hours as needed for Nausea. 30 Tab 1   ??? spironolactone (ALDACTONE) 50 mg tablet Take  by mouth daily.     ??? benazepril (LOTENSIN) 10 mg tablet Take 1 Tab by mouth daily. 30 Tab 3   ??? hydrOXYzine HCl (ATARAX) 50 mg tablet TK 1 T PO HS  2   ??? busPIRone (BUSPAR) 5 mg tablet TK 1 T PO BID  2    ??? escitalopram oxalate (LEXAPRO) 20 mg tablet TK 1 T PO  QAM  1   ??? CARTIA XT 240 mg ER capsule   2   ??? PROMETHEGAN 25 mg suppository INSERT 1 SUPPOSITORY RECTALLY Q 6 H PRN  0   ??? QUEtiapine (SEROQUEL) 100 mg tablet TK 1 T PO  QHS  1   ??? eszopiclone (LUNESTA) 3 mg tablet Take  by mouth nightly.     ??? prazosin (MINIPRESS) 1 mg capsule Take  by mouth nightly.     ??? lidocaine (LIDODERM) 5 %   9   ??? traZODone (DESYREL) 100 mg tablet Take 100 mg by mouth.       Allergies   Allergen Reactions   ??? Codeine Anaphylaxis   ??? Compazine [Prochlorperazine] Other (comments)     irritable   ??? Gabapentin Hives     Swollen throat   ??? 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 Reglan [Metoclopramide Hcl] Anxiety     irritable   ??? Zanaflex [Tizanidine] Rash     itchy   ??? Zofran (As Hydrochloride) [Ondansetron Hcl] Other (comments)  headaches     Patient Active Problem List    Diagnosis   ??? Recurrent depression (HCC)   ??? Paroxysmal SVT (supraventricular tachycardia) (HCC)   ??? Tobacco abuse   ??? DDD (degenerative disc disease), lumbar   ??? DDD (degenerative disc disease), cervical   ??? Essential hypertension   ??? Irritable bowel syndrome with diarrhea   ??? Iron deficiency anemia       Past Medical History:   Diagnosis Date   ??? Anemia    ??? Anxiety    ??? Arthritis    ??? Depression    ??? Hypertension    ??? Irritable bowel syndrome (IBS)     with diarrhea   ??? Migraine    ??? Paralysis (HCC)    ??? PTSD (post-traumatic stress disorder)    ??? SVT (supraventricular tachycardia) (HCC)      Past Surgical History:   Procedure Laterality Date   ??? HX CESAREAN SECTION      5   ??? HX CHOLECYSTECTOMY  2015   ??? HX GASTRIC BYPASS  2008   ??? HX GYN  2014    partial HYSTER   ??? HX ORTHOPAEDIC      9 screws in RT ankle and 2 plates in RT ankle   ??? HX OTHER SURGICAL  2014    neurostimulator implant   ??? HX OTHER SURGICAL  2012    power port RT arm    ??? HX OTHER SURGICAL      tummy tuck 2     Family History    Problem Relation Age of Onset   ??? Hypertension Mother    ??? Hypertension Father    ??? Osteoporosis Maternal Grandmother    ??? Hypertension Maternal Grandmother    ??? Heart Disease Maternal Grandmother    ??? Alcohol abuse Maternal Grandfather    ??? Hypertension Paternal Grandmother    ??? Breast Cancer Paternal Grandmother    ??? No Known Problems Paternal Grandfather      Social History   Substance Use Topics   ??? Smoking status: Current Some Day Smoker     Years: 22.00   ??? Smokeless tobacco: Never Used   ??? Alcohol use Yes      Comment: occ       ROS:    Constitutional: negative for fevers, fatigue and weight loss  Eyes: negative for visual disturbance  Ears, nose, mouth, throat, and face: negative for hearing loss and tinnitus  Respiratory: negative for cough  Cardiovascular: negative except as noted in HPI  Gastrointestinal: negative for change in bowel habits and abdominal pain  Genitourinary:negative for urinary issues  Integument: negative for rash  Hematologic/lymphatic: negative for bleeding or blood clots  Musculoskeletal:negative for joint pain  Neurological: negative for seizures and stroke  Behavioral/Psych: negative for anxiety and depression  Endocrine: negative for diabetic symptoms including polyuria, polydipsia and poor wound healing         PHYSICAL EXAM:     Visit Vitals   ??? BP (!) 150/100   ??? Pulse 80   ??? Ht 5\' 6"  (1.676 m)   ??? Wt 164 lb (74.4 kg)   ??? BMI 26.47 kg/m2        Wt Readings from Last 5 Encounters:   11/26/16 164 lb (74.4 kg)   10/17/16 164 lb (74.4 kg)   10/08/16 169 lb (76.7 kg)     BP Readings from Last 5 Encounters:   11/26/16 (!) 150/100   10/17/16 146/88  10/08/16 (!) 162/98         General appearance - Alert, well appearing, and in no distress   Mental status - Affect appropriate to mood.  Eyes - Sclerae anicteric,  ENMT - Hearing grossly normal bilaterally, Dental hygiene good.  Neck - Carotids upstroke normal bilaterally, no bruits, no JVD.   Resp - Clear to auscultation, no wheezes, rales or rhonchi, symmetric air entry.  Heart - Normal rate, regular rhythm, normal S1, S2, no murmurs, rubs, clicks or gallops.  GI - Soft, nontender, nondistended, no masses or organomegaly.  Neurological - Grossly intact - normal speech, no focal findings  Musculoskeletal - No joint tenderness, deformity or swelling, no muscular tenderness noted.  Extremities - Peripheral pulses normal, no pedal edema, no clubbing or cyanosis.  Skin - Normal coloration and turgor.  Psych -  oriented to person, place, and time.    Medical problems and test results were reviewed with the patient today.     No results found for any visits on 11/26/16.      No results found for this or any previous visit (from the past 672 hour(s)).  Lab Results   Component Value Date/Time    Cholesterol, total 134 10/08/2016 04:25 PM    HDL Cholesterol 86 10/08/2016 04:25 PM    LDL, calculated 34 10/08/2016 04:25 PM    VLDL, calculated 14 10/08/2016 04:25 PM    Triglyceride 70 10/08/2016 04:25 PM       EKG:  (EKG has been independently visualized by me and summarized below)  Sinus  Rhythm   WITHIN NORMAL LIMITS    ASSESSMENT and PLAN  1.  Tachycardia-currently defined by monitor as been sinus tachycardia.  We see no evidence by the data we have of any abnormal SVT.  We offered to obtain the records from [place] where she had received adenosine, but she reports that it did not break the tachycardia either.  She said that she is fine and does not want any procedures done anyway.    There is no charge for the patient's visit today.    Follow-up Disposition:  Return if symptoms worsen or fail to improve.            Edison Nasuti. Phoua Hoadley, M.D., F.A.C.C, F.H.R.S.  Cardiology/Electrophysiology  11/26/16  11:36 AM

## 2016-11-28 ENCOUNTER — Encounter: Attending: Family | Primary: Family

## 2017-01-01 ENCOUNTER — Telehealth

## 2017-01-01 NOTE — Telephone Encounter (Signed)
Sara Elliott.  Penny, do you know what this is, or can find out.  Who do I make referral too (pain management , etc)?

## 2017-01-01 NOTE — Telephone Encounter (Signed)
They called and stated that they need a [facility] referral. That it has to come through PCP.     They have it started already, but she cannot finish it until we do.

## 2017-01-02 NOTE — Telephone Encounter (Signed)
done

## 2017-01-02 NOTE — Telephone Encounter (Signed)
Can you place new referral for pain mgmnt please

## 2017-02-19 IMAGING — CT CT ABD-PELV W/ CM
2 of 4 series · 10 of 46 positions shown, 11 images · IV contrast (omnipaque)
Comparison: None.

CLINICAL DATA: Abdominal pain

EXAM:
CT ABDOMEN AND PELVIS WITH CONTRAST
TECHNIQUE: Multidetector CT imaging of the abdomen and pelvis was performed
using the standard protocol following bolus administration of
intravenous contrast.
CONTRAST:  100mL OMNIPAQUE IOHEXOL 300 MG/ML  SOLN

[Series 201: routine, idose (2) · axial · 0.82mm/px · z∈[+369,+764]mm · 7 of 95 slices shown, 8 images]
[im 8/95  soft-tissue]
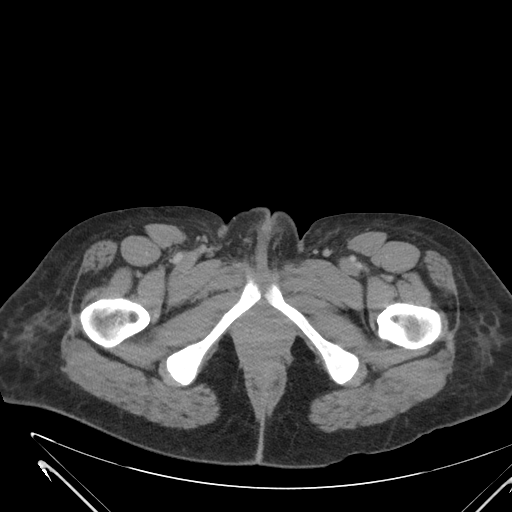
[im 8/95  bone]
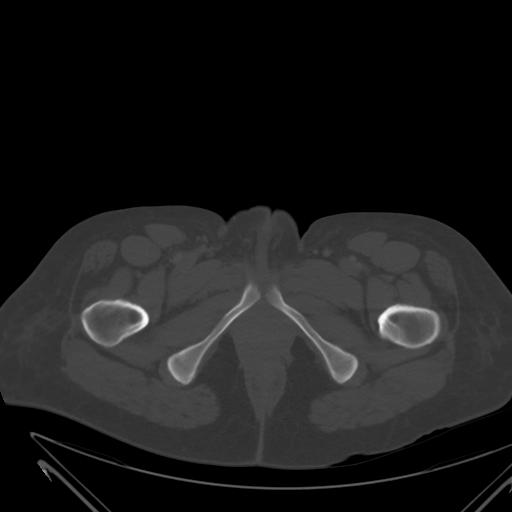
[im 23/95  soft-tissue]
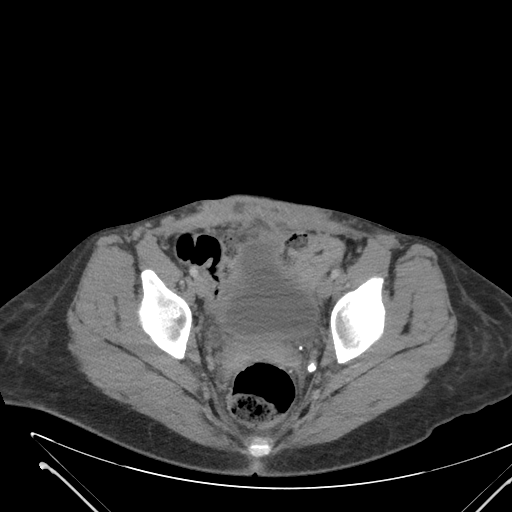
[im 34/95  soft-tissue]
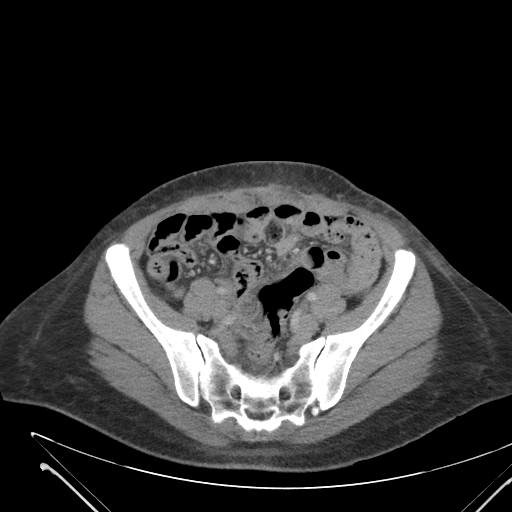
[im 49/95  soft-tissue]
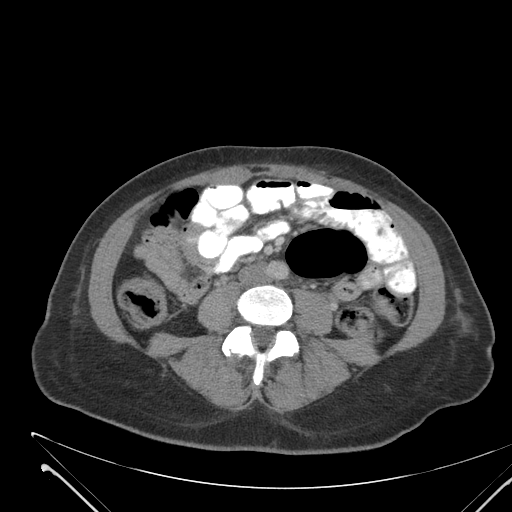
[im 61/95  soft-tissue]
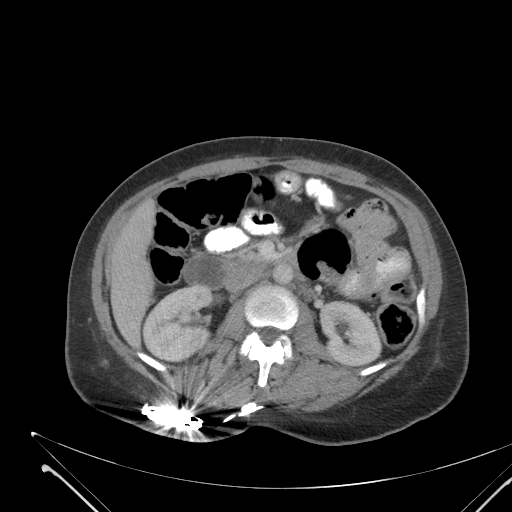
[im 72/95  soft-tissue]
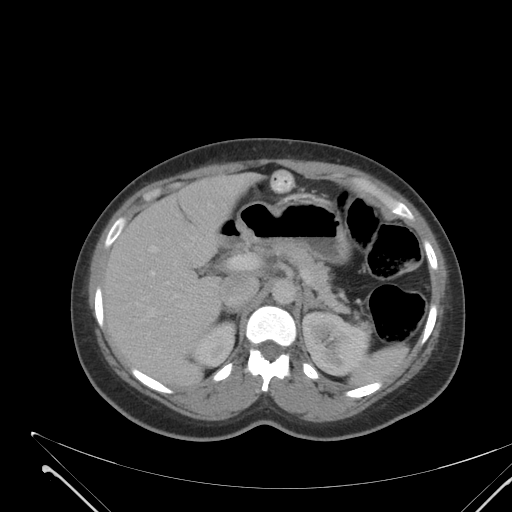
[im 87/95  soft-tissue]
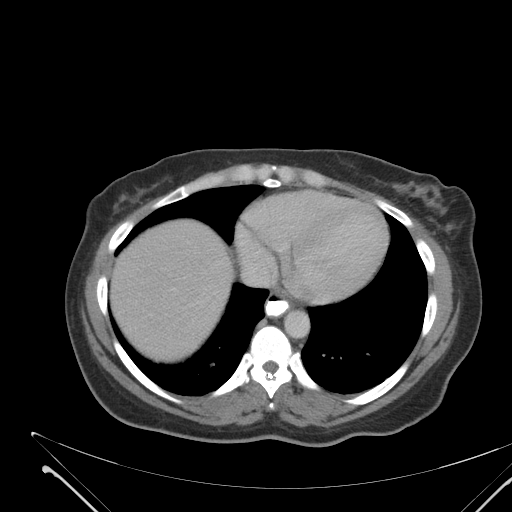

[Series 203: coronals, idose (2) · coronal · 0.45mm/px · 3 of 120 slices shown]
[im 40/120  soft-tissue]
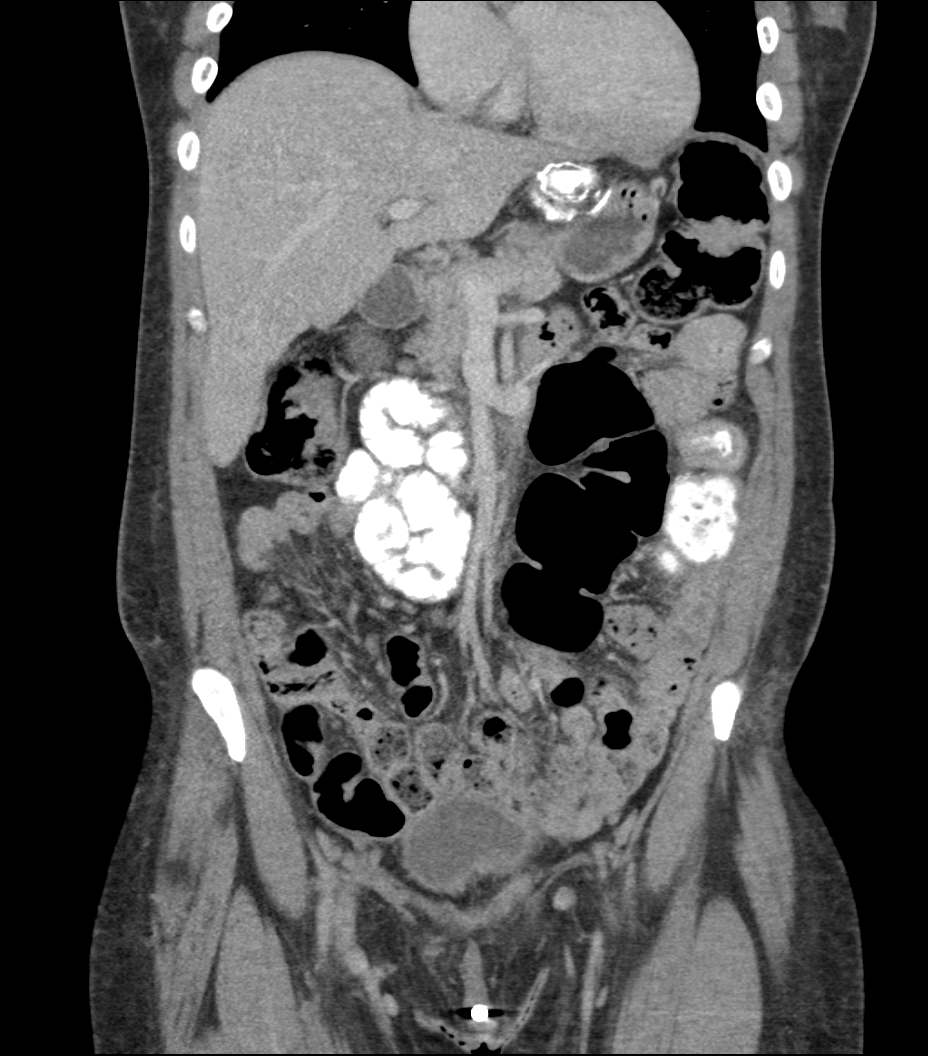
[im 53/120  soft-tissue]
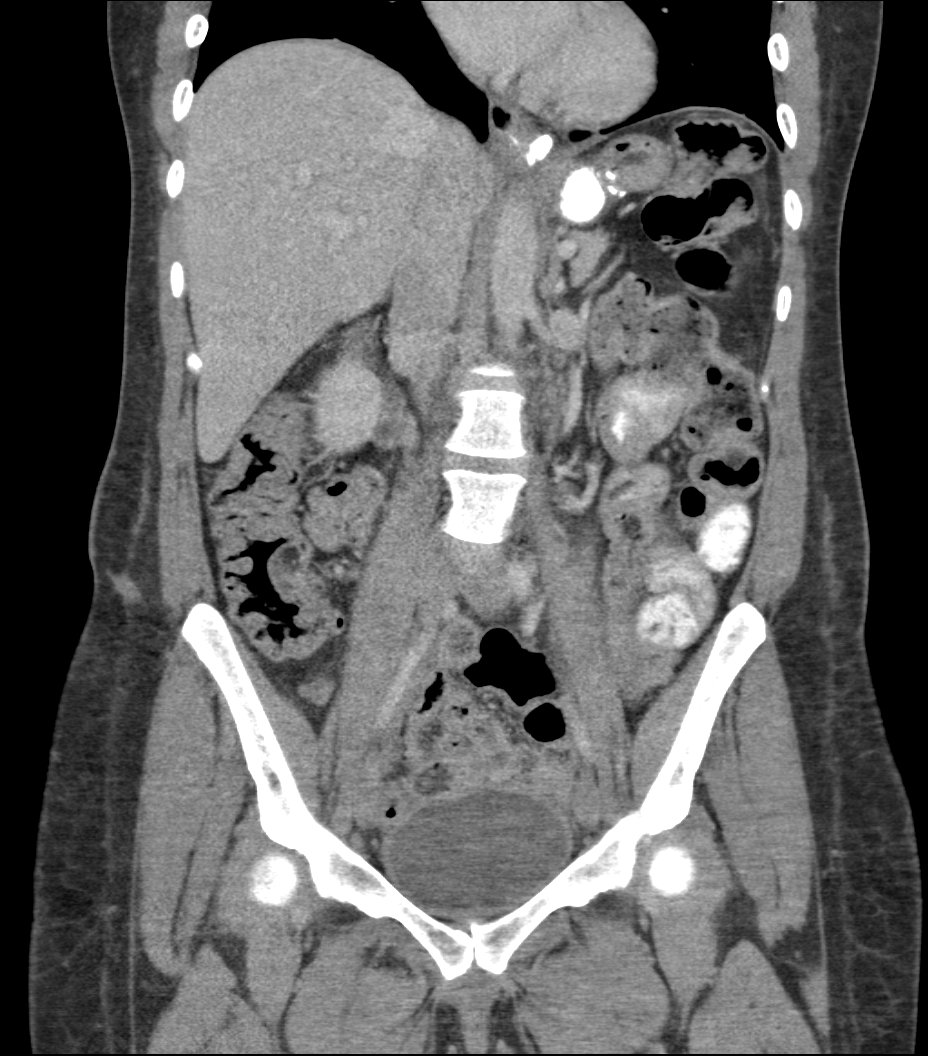
[im 67/120  soft-tissue]
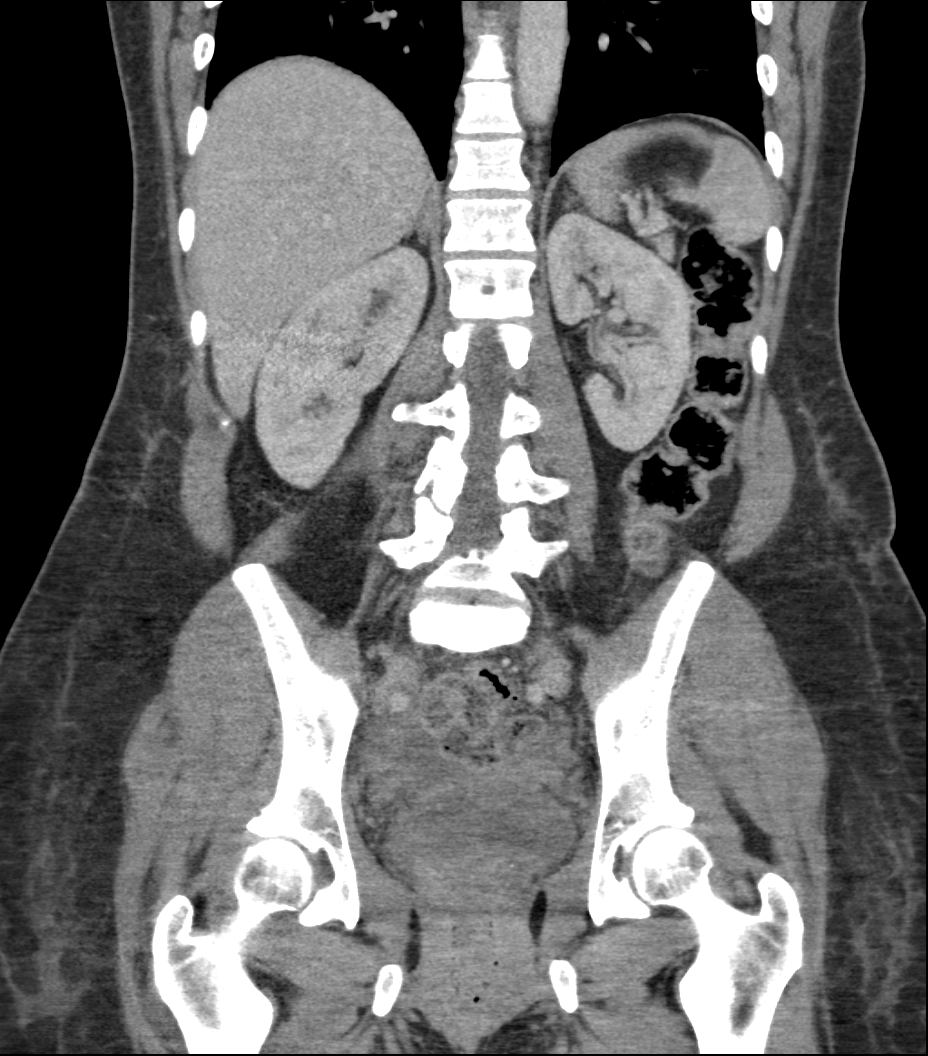

[10 of 46 positions shown; findings below may reference images not displayed]

FINDINGS: BODY WALL: Subcutaneous reticulation over the low pelvis is likely
scarring neighboring remote incision. Vulva piercing. Small fatty
right inguinal hernia.

LOWER CHEST: Small sliding hiatal hernia with gastroesophageal
reflux or poor clearance from the esophagus.

ABDOMEN/PELVIS:

Liver: No focal abnormality.

Biliary: Cholecystectomy.

Pancreas: Unremarkable.

Spleen: Unremarkable.

Adrenals: Unremarkable.

Kidneys and ureters: No hydronephrosis or stone. 1 cm probable cyst
in the upper right kidney.

Bladder: Unremarkable.

Reproductive: Hysterectomy. The right ovary at least likely remains,
and is contiguous with small volume simple density fluid in the
pelvis.

Bowel: Gastric bypass without obstruction. No inflammatory bowel
wall thickening. Prominent volume of stool and gas throughout the
colon. No appendicitis.

Retroperitoneum: No mass or adenopathy.

Peritoneum: Small free pelvic fluid which could be physiologic given
the right ovary likely remains.

Vascular: No acute abnormality.

OSSEOUS: Dorsal column stimulator. Relatively mild L5-S1 disc
degeneration.
IMPRESSION: 1. Prominent colonic gas and stool.
2. Small pelvic fluid which can be physiologic.
3. Gastric bypass, cholecystectomy, and hysterectomy.
